# Patient Record
Sex: Male | Born: 2005 | Race: Black or African American | Hispanic: No | Marital: Single | Smoking: Never smoker
Health system: Southern US, Community
[De-identification: ages and names within clinical notes are randomized; demographics above are authoritative.]

## PROBLEM LIST (undated history)

## (undated) DIAGNOSIS — E611 Iron deficiency: Secondary | ICD-10-CM

## (undated) DIAGNOSIS — J45909 Unspecified asthma, uncomplicated: Secondary | ICD-10-CM

---
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
## 2025-01-27 ENCOUNTER — Ambulatory Visit: Admitting: Family Medicine
